# Patient Record
Sex: Female | Born: 1995 | Race: White | Hispanic: No | Marital: Single | Smoking: Never smoker
Health system: Southern US, Community
[De-identification: ages and names within clinical notes are randomized; demographics above are authoritative.]

## PROBLEM LIST (undated history)

## (undated) DIAGNOSIS — Z789 Other specified health status: Secondary | ICD-10-CM

## (undated) HISTORY — PX: NO PAST SURGERIES: SHX2092

---
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
## 2015-05-18 ENCOUNTER — Encounter (HOSPITAL_COMMUNITY): Payer: Self-pay | Admitting: *Deleted

## 2015-05-18 ENCOUNTER — Emergency Department (HOSPITAL_COMMUNITY)
Admission: EM | Admit: 2015-05-18 | Discharge: 2015-05-19 | Disposition: A | Discharge status: Home / Self Care | Payer: BLUE CROSS/BLUE SHIELD | Attending: Emergency Medicine | Admitting: Emergency Medicine

## 2015-05-18 DIAGNOSIS — Y9289 Other specified places as the place of occurrence of the external cause: Secondary | ICD-10-CM | POA: Insufficient documentation

## 2015-05-18 DIAGNOSIS — Y998 Other external cause status: Secondary | ICD-10-CM | POA: Insufficient documentation

## 2015-05-18 DIAGNOSIS — Z79899 Other long term (current) drug therapy: Secondary | ICD-10-CM | POA: Insufficient documentation

## 2015-05-18 DIAGNOSIS — Y9389 Activity, other specified: Secondary | ICD-10-CM | POA: Insufficient documentation

## 2015-05-18 DIAGNOSIS — IMO0001 Reserved for inherently not codable concepts without codable children: Secondary | ICD-10-CM

## 2015-05-18 DIAGNOSIS — R5383 Other fatigue: Secondary | ICD-10-CM | POA: Insufficient documentation

## 2015-05-18 DIAGNOSIS — T450X2A Poisoning by antiallergic and antiemetic drugs, intentional self-harm, initial encounter: Secondary | ICD-10-CM | POA: Insufficient documentation

## 2015-05-18 DIAGNOSIS — T404X2A Poisoning by other synthetic narcotics, intentional self-harm, initial encounter: Secondary | ICD-10-CM | POA: Insufficient documentation

## 2015-05-18 DIAGNOSIS — R51 Headache: Secondary | ICD-10-CM | POA: Insufficient documentation

## 2015-05-18 LAB — COMPREHENSIVE METABOLIC PANEL
ALBUMIN: 4.1 g/dL (ref 3.5–5.0)
ALT: 13 U/L — AB (ref 14–54)
AST: 16 U/L (ref 15–41)
Alkaline Phosphatase: 53 U/L (ref 38–126)
Anion gap: 9 (ref 5–15)
BUN: 9 mg/dL (ref 6–20)
CHLORIDE: 107 mmol/L (ref 101–111)
CO2: 24 mmol/L (ref 22–32)
CREATININE: 0.96 mg/dL (ref 0.44–1.00)
Calcium: 9.8 mg/dL (ref 8.9–10.3)
GFR calc non Af Amer: >60 mL/min (ref >60)
Glucose, Bld: 94 mg/dL (ref 65–99)
Potassium: 3.6 mmol/L (ref 3.5–5.1)
SODIUM: 140 mmol/L (ref 135–145)
Total Bilirubin: 0.3 mg/dL (ref 0.3–1.2)
Total Protein: 7.8 g/dL (ref 6.5–8.1)

## 2015-05-18 LAB — CBC
HCT: 39.4 % (ref 36.0–46.0)
HEMOGLOBIN: 13.7 g/dL (ref 12.0–15.0)
MCH: 29.5 pg (ref 26.0–34.0)
MCHC: 34.8 g/dL (ref 30.0–36.0)
MCV: 84.7 fL (ref 78.0–100.0)
Platelets: 271 10*3/uL (ref 150–400)
RBC: 4.65 MIL/uL (ref 3.87–5.11)
RDW: 12.3 % (ref 11.5–15.5)
WBC: 8.6 10*3/uL (ref 4.0–10.5)

## 2015-05-18 LAB — RAPID URINE DRUG SCREEN, HOSP PERFORMED
Amphetamines: NOT DETECTED
Barbiturates: NOT DETECTED
Benzodiazepines: NOT DETECTED
COCAINE: NOT DETECTED
OPIATES: NOT DETECTED
TETRAHYDROCANNABINOL: NOT DETECTED

## 2015-05-18 LAB — ETHANOL: Alcohol, Ethyl (B): 15 mg/dL — ABNORMAL HIGH (ref <5)

## 2015-05-18 LAB — ACETAMINOPHEN LEVEL: Acetaminophen (Tylenol), Serum: <10 ug/mL — ABNORMAL LOW (ref 10–30)

## 2015-05-18 LAB — SALICYLATE LEVEL

## 2015-05-18 NOTE — ED Notes (Signed)
The pt took 4  4mg  zofran and tramadol 50 mg x 4 at 1910.  She last ate at 1200 n.  She denies being suicidal  lmp 2 weeks ago

## 2015-05-18 NOTE — ED Notes (Signed)
Zavitz, MD at bedside 
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
## 2015-05-18 NOTE — Discharge Instructions (Signed)
Call the mental health number on the back of your [facility] card to arrange appointment. If you have any difficulty contact behavioral health [place]. If you have further suicidal ideation or any concerns return to the ER  If you were given medicines take as directed.  If you are on coumadin or contraceptives realize their levels and effectiveness is altered by many different medicines.  If you have any reaction (rash, tongues swelling, other) to the medicines stop taking and see a physician.    If your blood pressure was elevated in the ER make sure you follow up for management with a primary doctor or return for chest pain, shortness of breath or stroke symptoms.  Please follow up as directed and return to the ER or see a physician for new or worsening symptoms.  Thank you. Filed Vitals:   05/18/15 2003 05/18/15 2213 05/18/15 2320 05/18/15 2324  BP: 126/89 87/48 116/76   Pulse: 96 85  76  Temp: 97.5 F (36.4 C)     TempSrc: Oral     Resp: 16 16  16   SpO2: 100% 100%  100%

## 2015-05-18 NOTE — ED Notes (Signed)
Drowsiness, nausea/ vomiting, pinpoint pupils, agitation, mild bradycardia and hypotension. Severe toxicity = hypoxemia. Toxic dose is 2-8 grams. Max dose is 400 mg/day
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
## 2015-05-21 ENCOUNTER — Telehealth: Payer: Self-pay | Admitting: *Deleted

## 2017-07-02 ENCOUNTER — Ambulatory Visit (HOSPITAL_COMMUNITY)
Admission: RE | Admit: 2017-07-02 | Discharge: 2017-07-02 | Disposition: A | Discharge status: Home / Self Care | Payer: BLUE CROSS/BLUE SHIELD | Source: Ambulatory Visit | Attending: Chiropractic Medicine | Admitting: Chiropractic Medicine

## 2017-07-02 ENCOUNTER — Other Ambulatory Visit (HOSPITAL_COMMUNITY): Payer: Self-pay | Admitting: Chiropractic Medicine

## 2017-07-02 ENCOUNTER — Emergency Department (HOSPITAL_COMMUNITY)
Admission: EM | Admit: 2017-07-02 | Discharge: 2017-07-02 | Discharge status: Absent without leave | Payer: BLUE CROSS/BLUE SHIELD | Source: Home / Self Care

## 2017-07-02 DIAGNOSIS — R52 Pain, unspecified: Secondary | ICD-10-CM

## 2017-07-02 DIAGNOSIS — R102 Pelvic and perineal pain: Secondary | ICD-10-CM | POA: Insufficient documentation

## 2017-07-02 DIAGNOSIS — M545 Low back pain: Secondary | ICD-10-CM | POA: Insufficient documentation

## 2017-07-02 DIAGNOSIS — M4184 Other forms of scoliosis, thoracic region: Secondary | ICD-10-CM | POA: Insufficient documentation

## 2017-07-02 DIAGNOSIS — M25562 Pain in left knee: Secondary | ICD-10-CM | POA: Insufficient documentation

## 2017-07-02 DIAGNOSIS — M542 Cervicalgia: Secondary | ICD-10-CM | POA: Diagnosis not present

## 2017-07-02 DIAGNOSIS — M546 Pain in thoracic spine: Secondary | ICD-10-CM | POA: Insufficient documentation

## 2017-08-03 ENCOUNTER — Other Ambulatory Visit (HOSPITAL_COMMUNITY): Payer: Self-pay | Admitting: Chiropractic Medicine

## 2017-08-03 DIAGNOSIS — M25562 Pain in left knee: Secondary | ICD-10-CM

## 2017-08-05 ENCOUNTER — Ambulatory Visit (HOSPITAL_COMMUNITY)
Admission: RE | Admit: 2017-08-05 | Discharge: 2017-08-05 | Disposition: A | Discharge status: Home / Self Care | Payer: BLUE CROSS/BLUE SHIELD | Source: Ambulatory Visit | Attending: Chiropractic Medicine | Admitting: Chiropractic Medicine

## 2017-08-05 ENCOUNTER — Ambulatory Visit (HOSPITAL_COMMUNITY): Payer: No Typology Code available for payment source

## 2017-08-05 DIAGNOSIS — S8002XA Contusion of left knee, initial encounter: Secondary | ICD-10-CM | POA: Insufficient documentation

## 2017-08-05 DIAGNOSIS — M25562 Pain in left knee: Secondary | ICD-10-CM
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
